# Patient Record
Sex: Male | Born: 1960 | Race: White | Hispanic: No | Marital: Married | Smoking: Never smoker
Health system: Southern US, Community
[De-identification: ages and names within clinical notes are randomized; demographics above are authoritative.]

## PROBLEM LIST (undated history)

## (undated) DIAGNOSIS — I639 Cerebral infarction, unspecified: Secondary | ICD-10-CM

## (undated) DIAGNOSIS — I219 Acute myocardial infarction, unspecified: Secondary | ICD-10-CM

## (undated) DIAGNOSIS — I10 Essential (primary) hypertension: Secondary | ICD-10-CM

---

## 2017-01-06 ENCOUNTER — Emergency Department (HOSPITAL_COMMUNITY)
Admission: EM | Admit: 2017-01-06 | Discharge: 2017-01-06 | Disposition: A | Discharge status: Home / Self Care | Payer: Managed Care, Other (non HMO) | Attending: Emergency Medicine | Admitting: Emergency Medicine

## 2017-01-06 ENCOUNTER — Encounter (HOSPITAL_COMMUNITY): Payer: Self-pay | Admitting: Emergency Medicine

## 2017-01-06 ENCOUNTER — Emergency Department (HOSPITAL_COMMUNITY): Payer: Managed Care, Other (non HMO)

## 2017-01-06 DIAGNOSIS — I252 Old myocardial infarction: Secondary | ICD-10-CM | POA: Diagnosis not present

## 2017-01-06 DIAGNOSIS — R55 Syncope and collapse: Secondary | ICD-10-CM | POA: Diagnosis not present

## 2017-01-06 DIAGNOSIS — R4781 Slurred speech: Secondary | ICD-10-CM | POA: Diagnosis not present

## 2017-01-06 DIAGNOSIS — R479 Unspecified speech disturbances: Secondary | ICD-10-CM | POA: Diagnosis present

## 2017-01-06 DIAGNOSIS — I10 Essential (primary) hypertension: Secondary | ICD-10-CM | POA: Insufficient documentation

## 2017-01-06 DIAGNOSIS — F10129 Alcohol abuse with intoxication, unspecified: Secondary | ICD-10-CM | POA: Insufficient documentation

## 2017-01-06 DIAGNOSIS — Z8673 Personal history of transient ischemic attack (TIA), and cerebral infarction without residual deficits: Secondary | ICD-10-CM | POA: Insufficient documentation

## 2017-01-06 DIAGNOSIS — F10920 Alcohol use, unspecified with intoxication, uncomplicated: Secondary | ICD-10-CM

## 2017-01-06 HISTORY — DX: Cerebral infarction, unspecified: I63.9

## 2017-01-06 HISTORY — DX: Acute myocardial infarction, unspecified: I21.9

## 2017-01-06 HISTORY — DX: Essential (primary) hypertension: I10

## 2017-01-06 LAB — COMPREHENSIVE METABOLIC PANEL
ALBUMIN: 4.1 g/dL (ref 3.5–5.0)
ALK PHOS: 69 U/L (ref 38–126)
ALT: 86 U/L — ABNORMAL HIGH (ref 17–63)
ANION GAP: 16 — AB (ref 5–15)
AST: 53 U/L — ABNORMAL HIGH (ref 15–41)
BILIRUBIN TOTAL: 0.6 mg/dL (ref 0.3–1.2)
BUN: 14 mg/dL (ref 6–20)
CALCIUM: 8.6 mg/dL — AB (ref 8.9–10.3)
CO2: 17 mmol/L — ABNORMAL LOW (ref 22–32)
Chloride: 107 mmol/L (ref 101–111)
Creatinine, Ser: 1.35 mg/dL — ABNORMAL HIGH (ref 0.61–1.24)
GFR calc non Af Amer: 58 mL/min — ABNORMAL LOW (ref >60)
GLUCOSE: 140 mg/dL — AB (ref 65–99)
Potassium: 3.7 mmol/L (ref 3.5–5.1)
Sodium: 140 mmol/L (ref 135–145)
TOTAL PROTEIN: 6.9 g/dL (ref 6.5–8.1)

## 2017-01-06 LAB — APTT: aPTT: 25 seconds (ref 24–36)

## 2017-01-06 LAB — CBC
HEMATOCRIT: 47.3 % (ref 39.0–52.0)
HEMOGLOBIN: 17 g/dL (ref 13.0–17.0)
MCH: 34.1 pg — AB (ref 26.0–34.0)
MCHC: 35.9 g/dL (ref 30.0–36.0)
MCV: 95 fL (ref 78.0–100.0)
Platelets: 147 10*3/uL — ABNORMAL LOW (ref 150–400)
RBC: 4.98 MIL/uL (ref 4.22–5.81)
RDW: 12.8 % (ref 11.5–15.5)
WBC: 6.8 10*3/uL (ref 4.0–10.5)

## 2017-01-06 LAB — CBG MONITORING, ED: GLUCOSE-CAPILLARY: 141 mg/dL — AB (ref 65–99)

## 2017-01-06 LAB — DIFFERENTIAL
Basophils Absolute: 0 10*3/uL (ref 0.0–0.1)
Basophils Relative: 1 %
EOS PCT: 6 %
Eosinophils Absolute: 0.4 10*3/uL (ref 0.0–0.7)
LYMPHS ABS: 2.5 10*3/uL (ref 0.7–4.0)
LYMPHS PCT: 36 %
MONO ABS: 0.5 10*3/uL (ref 0.1–1.0)
MONOS PCT: 7 %
Neutro Abs: 3.4 10*3/uL (ref 1.7–7.7)
Neutrophils Relative %: 50 %

## 2017-01-06 LAB — ETHANOL: Alcohol, Ethyl (B): 306 mg/dL (ref <5)

## 2017-01-06 LAB — PROTIME-INR
INR: 0.98
Prothrombin Time: 13 seconds (ref 11.4–15.2)

## 2017-01-06 LAB — I-STAT TROPONIN, ED: Troponin i, poc: 0 ng/mL (ref 0.00–0.08)

## 2017-01-06 NOTE — ED Notes (Signed)
Pt asking what happened  He was at a party and drank too much alcohol.  He has texted his wife

## 2017-01-06 NOTE — ED Notes (Signed)
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

## 2017-01-06 NOTE — ED Notes (Signed)
Please contact wife, @ [phone] Lynelle Doctor(Juli [place]ashomb)

## 2017-01-06 NOTE — ED Notes (Signed)
The pts wife has called and talked to him from [place]

## 2017-01-06 NOTE — ED Notes (Signed)
Pt given a urinal  Sleeping unless disturbed

## 2017-01-06 NOTE — Consult Note (Signed)
Neurology Consultation Reason for Consult: Difficulty with speech Referring Physician: Blinda LeatherwoodPollina, C  CC: Speech difficulty  History is obtained from: Patient  HPI: Twana FirstJohn Winship is a 56 y.o. male who is in his normal state of health earlier tonight. He began drinking with friends and then he was seen by his friends to slump to the side and drop his pizza on himself. Since that time, he has been having persistent difficulty with speech and therefore EMS was called.  LKW: Unclear, but likely around 11 PM tpa given?: no, mild symptoms   ROS: Limited due to degree of intoxication  PMHx: Heart attack Stroke  Family history: Did not obtain due to degree of intoxication  Social History: Had a "lot" to drink tonight  Exam: Current vital signs: Vitals:   01/06/17 0203  BP: (!) 131/100  Pulse: 79  Resp: 16  Temp: 97.5 F (36.4 C)    Vital signs in last 24 hours:    Physical Exam  Constitutional: Appears well-developed and well-nourished.  Psych: Affect appropriate to situation Eyes: No scleral injection HENT: No OP obstrucion Head: Normocephalic.  Cardiovascular: Normal rate and regular rhythm.  Respiratory: Effort normal and breath sounds normal to anterior ascultation GI: Soft.  No distension. There is no tenderness.  Skin: WDI  Neuro: Mental Status: Patient is awake, alert, oriented to person, place, month, year, and situation. Patient is able to give a clear and coherent history. No signs of neglect He has some difficulty with word finding and is slightly tangential in conversation, appears intoxicated. He is dysarthric Cranial Nerves: II: Visual Fields are full. Pupils are equal, round, and reactive to light.   III,IV, VI: EOMI without ptosis or diploplia. He has mild nystagmus V: Facial sensation is symmetric to temperature VII: Facial movement is symmetric.  VIII: hearing is intact to voice X: Uvula elevates symmetrically XI: Shoulder shrug is symmetric. XII:  tongue is midline without atrophy or fasciculations.  Motor: Tone is normal. Bulk is normal. 5/5 strength was present in all four extremities.  Sensory: Sensation is symmetric to light touch and temperature in the arms and legs. Cerebellar: He has mild ataxia on finger-nose-finger bilaterally    I have reviewed labs in epic and the results pertinent to this consultation are: Mild thrombocytopenia  I have reviewed the images obtained: CT head-old stroke, nothing acute  Impression: 56 year old male with a history of stroke who presents with altered mental status in the setting of severe intoxication. I suspect that this all is related to his intoxication, however he does seem to have slightly more word finding difficulty then I would expect. Given his history of stroke, it would not be unreasonable to obtain an MRI.  Both because I am not certain of his last known well given the amount that he has had to drink as well as the fact that his symptoms are mild and I would not favor IV TPA at this time.  Recommendations: 1) could consider MRI brain 2) certainly if he sobers up and still has residual symptoms, then an MRI would be indicated. 3) if MRI is negative, or if he is asymptomatic after sobering up then no further recommendations.   Ritta SlotMcNeill Maryiah Olvey, MD [facility] [phone]  If 7pm- 7am, please page neurology on call as listed in AMION.

## 2017-01-06 NOTE — ED Notes (Signed)
Mri calling for the pt

## 2017-01-06 NOTE — ED Provider Notes (Signed)
[facility]-EMERGENCY DEPT Provider Note   CSN: [id number] Arrival date & time: 01/06/17  0131  By signing my name below, I, Dushaun Okey, attest that this documentation has been prepared under the direction and in the presence of Gilda Crease, MD. Electronically Signed: Elder Negus, Scribe. 01/06/17. 5:41 AM.   History   Chief Complaint Chief Complaint  Patient presents with  . Code Stroke    stroke like symptoms    HPI Gary Love is a 56 y.o. male with reported history of a prior stroke and MI who presents to the ED as a CODE STROKE. According to medic report, this patient was in his usual health until 2 hours ago while drinking alcohol with his friends. The friends stated that he suddenly fell to the left and "dropped everything". At interview, the patient is having difficulty forming long sentences. He is unable to fully characterize his symptoms. Medic denies any focal weaknesses or decreased sensation. No facial asymmetry.  Patient presents to the emergency department for evaluation of syncope. Patient was with friends and drinking alcohol. He apparently slumped over and then when he awakened he was having trouble speaking. Patient told EMS that he has a history of a heart attack and a stroke. When they noticed that he was having difficulty speaking, they initiated a code stroke.  At arrival, patient is extremely intoxicated. He is having difficulty answering questions. Level V Caveat due to intoxication.   The history is provided by the patient. No language interpreter was used.    Past Medical History:  Diagnosis Date  . Hypertension   . MI (myocardial infarction)   . Stroke [facility])     There are no active problems to display for this patient.   History reviewed. No pertinent surgical history.     Home Medications    Prior to Admission medications   Not on File    Family History History reviewed. No pertinent family history.  Social History Social  History  Substance Use Topics  . Smoking status: Never Smoker  . Smokeless tobacco: Never Used  . Alcohol use Yes     Allergies   Patient has no known allergies.   Review of Systems Review of Systems  Unable to perform ROS: Other  Neurological: Negative for facial asymmetry, weakness and numbness.       Speech problem  All other systems reviewed and are negative.    Physical Exam Updated Vital Signs BP (!) 144/74   Pulse 88   Temp 97.5 F (36.4 C) (Oral)   Resp (!) 21   Ht 5\' 11"  (1.803 m)   Wt 254 lb 6.6 oz (115.4 kg)   SpO2 99%   BMI 35.48 kg/m   Physical Exam  Constitutional: He is oriented to person, place, and time. He appears well-developed and well-nourished. No distress.  HENT:  Head: Normocephalic and atraumatic.  Right Ear: Hearing normal.  Left Ear: Hearing normal.  Nose: Nose normal.  Mouth/Throat: Oropharynx is clear and moist and mucous membranes are normal.  Eyes: Conjunctivae and EOM are normal. Pupils are equal, round, and reactive to light.  Neck: Normal range of motion. Neck supple.  Cardiovascular: Regular rhythm, S1 normal and S2 normal.  Exam reveals no gallop and no friction rub.   No murmur heard. Pulmonary/Chest: Effort normal and breath sounds normal. No respiratory distress. He exhibits no tenderness.  Abdominal: Soft. Normal appearance and bowel sounds are normal. There is no hepatosplenomegaly. There is no tenderness. There is no  rebound, no guarding, no tenderness at McBurney's point and negative Murphy's sign. No hernia.  Musculoskeletal: Normal range of motion.  Neurological: He is alert and oriented to person, place, and time. He has normal strength. No cranial nerve deficit or sensory deficit. Coordination normal. GCS eye subscore is 4. GCS verbal subscore is 5. GCS motor subscore is 6.  Extraocular muscle movement: normal No visual field cut Pupils: equal and reactive both direct and consensual response is normal No nystagmus  present    Sensory function is intact to light touch, pinprick Proprioception intact  Grip strength 5/5 symmetric in upper extremities No pronator drift Normal finger to nose bilaterally  Lower extremity strength 5/5 against gravity Normal heel to shin bilaterally    Skin: Skin is warm, dry and intact. No rash noted. No cyanosis.  Psychiatric: He has a normal mood and affect. His speech is normal and behavior is normal. Thought content normal.  Nursing note and vitals reviewed.    ED Treatments / Results  Labs (all labs ordered are listed, but only abnormal results are displayed) Labs Reviewed  CBC - Abnormal; Notable for the following:       Result Value   MCH 34.1 (*)    Platelets 147 (*)    All other components within normal limits  COMPREHENSIVE METABOLIC PANEL - Abnormal; Notable for the following:    CO2 17 (*)    Glucose, Bld 140 (*)    Creatinine, Ser 1.35 (*)    Calcium 8.6 (*)    AST 53 (*)    ALT 86 (*)    GFR calc non Af Amer 58 (*)    Anion gap 16 (*)    All other components within normal limits  ETHANOL - Abnormal; Notable for the following:    Alcohol, Ethyl (B) 306 (*)    All other components within normal limits  CBG MONITORING, ED - Abnormal; Notable for the following:    Glucose-Capillary 141 (*)    All other components within normal limits  PROTIME-INR  APTT  DIFFERENTIAL  I-STAT TROPOININ, ED  I-STAT CHEM 8, ED    EKG  EKG Interpretation  Date/Time:  Sunday January 06 2017 01:54:55 EDT Ventricular Rate:  80 PR Interval:    QRS Duration: 88 QT Interval:  398 QTC Calculation: 460 R Axis:   82 Text Interpretation:  Sinus rhythm Anterior infarct, old Borderline repolarization abnormality Baseline wander in lead(s) I II aVR No previous tracing Confirmed by Blinda LeatherwoodPOLLINA  MD, CHRISTOPHER [phone] on 01/06/2017 2:08:45 AM       Radiology Mr Brain Wo Contrast  Result Date: 01/06/2017 CLINICAL DATA:  Speech difficulty. EXAM: MRI HEAD WITHOUT  CONTRAST TECHNIQUE: Multiplanar, multiecho pulse sequences of the brain and surrounding structures were obtained without intravenous contrast. COMPARISON:  Head CT 01/06/2017 FINDINGS: Despite efforts by the technologist and patient, motion artifact is present on today's examination and could not be eliminated. This reduces the sensitivity and specificity of the study. Brain: Diffusion-weighted imaging is severely degraded by motion, but there is no large area of ischemia. There is a normal disc infarct along the lateral aspect of the right lentiform nucleus extending along the right external capsule. Minimal periventricular T2 hyperintensity. No hydrocephalus or extra-axial fluid collection. The midline structures are normal. No age advanced or lobar predominant atrophy. Vascular: Major intracranial arterial and venous sinus flow voids are preserved. No evidence of chronic microhemorrhage or amyloid angiopathy. Skull and upper cervical spine: The visualized skull base, calvarium, upper  cervical spine and extracranial soft tissues are normal. Sinuses/Orbits: No fluid levels or advanced mucosal thickening. No mastoid effusion. Normal orbits. IMPRESSION: Motion degraded examination without visible evidence of acute intracranial abnormality. 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 Electronically Signed   By: Deatra Robinson M.D.   On: 01/06/2017 05:11   Ct Head Code Stroke W/o Cm  Result Date: 01/06/2017 CLINICAL DATA:  Code stroke.  Altered mental status EXAM: CT HEAD WITHOUT CONTRAST TECHNIQUE: Contiguous axial images were obtained from the base of the skull through the vertex without intravenous contrast. COMPARISON:  None. FINDINGS: Brain: There is an old right lenticular capsular infarct. No evidence of a a acute cortical infarct. There is periventricular hypoattenuation compatible with chronic microvascular disease. No intracranial hemorrhage. No midline shift or mass effect. Vascular: No  hyperdense vessel. Skull: Normal Sinuses/Orbits: The visualized portions of the paranasal sinuses and mastoid air cells are free of fluid. No advanced mucosal thickening. The visualized orbits are normal. Other: None ASPECTS (Alberta Stroke Program Early CT Score) - Ganglionic level infarction (caudate, lentiform nuclei, internal capsule, insula, M1-M3 cortex): 7 - Supraganglionic infarction (M4-M6 cortex): 3 Total score (0-10 with 10 being normal): 10 IMPRESSION: 1. Old right lenticulocapsular infarct without acute intracranial abnormality. 2. ASPECTS is 10. These results were called by telephone at the time of interpretation on 01/06/2017 at 1:53 am to Dr. Ritta Slot, who verbally acknowledged these results. Electronically Signed   By: Deatra Robinson M.D.   On: 01/06/2017 01:54    Procedures Procedures (including critical care time)  Medications Ordered in ED Medications - No data to display   Initial Impression / Assessment and Plan / ED Course  I have reviewed the triage vital signs and the nursing notes.  Pertinent labs & imaging results that were available during my care of the patient were reviewed by me and considered in my medical decision making (see chart for details).     Patient brought to the ER as a code stroke. Patient passed out while drinking alcohol. When he awakened he was slurring his words and having difficulty finding words, EMS initiated code stroke because of his speech difficulty. Upon arrival to the ER, he does not examine as if he has expressive aphasia. Symptoms are only intermittently present and most likely consistent with alcohol intoxication. With his previous history, however, workup was initiated. Screening CT did not show any acute abnormality. He did not have any focal findings on his neurologic exam. It was decided, in conjunction with Dr. Amada Jupiter of neurology, to perform MRI to rule out stroke. MRI has been performed and does not show any evidence of  acute stroke. Symptoms are all likely secondary to his intoxication. Patient will be allowed to sober up and then will be discharged.  Final Clinical Impressions(s) / ED Diagnoses   Final diagnoses:  Syncope, unspecified syncope type  Alcoholic intoxication without complication (HCC)    New Prescriptions New Prescriptions   No medications on file  I personally performed the services described in this documentation, which was scribed in my presence. The recorded information has been reviewed and is accurate.    Gilda Crease, MD 01/06/17 [phone]

## 2017-01-06 NOTE — ED Triage Notes (Signed)
Pt brought to ED by [facility] from a business party after having some stroke symptoms, per EMS, pt was eating and he dropped his food on the left side and fell to the left side. Having some aphasia on EMS arrival, some ETOH on board, prior history of stroke. BP 137/87, HR 88, CBG 128.

## 2017-01-06 NOTE — ED Notes (Signed)
Pt passes the swallow screen 

## 2018-10-13 IMAGING — MR MR HEAD W/O CM
10 of 12 series · 42 of 48 positions shown · non-contrast
Comparison: Head CT 01/06/2017

CLINICAL DATA: Speech difficulty.

EXAM:
MRI HEAD WITHOUT CONTRAST
TECHNIQUE: Multiplanar, multiecho pulse sequences of the brain and surrounding
structures were obtained without intravenous contrast.

[Series 4: DWI · axial · 3.0mm · 1.09mm/px · z∈[-59,+88]mm · 9 of 100 slices shown (1 of 6)]
[im 1/100]
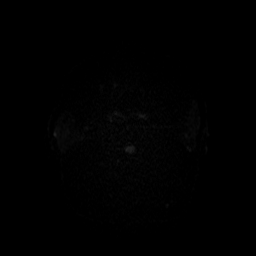
[im 13/100]
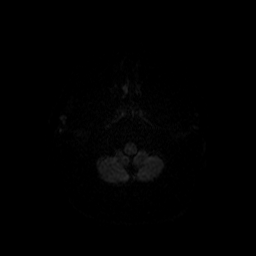
[im 25/100]
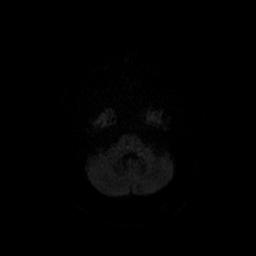
[im 38/100]
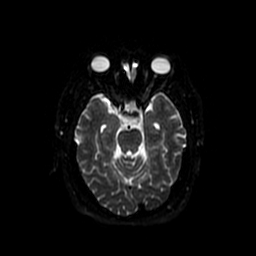
[im 50/100]
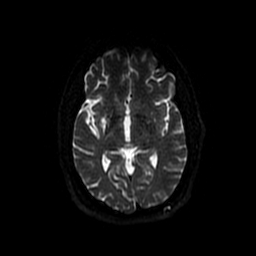
[im 62/100]
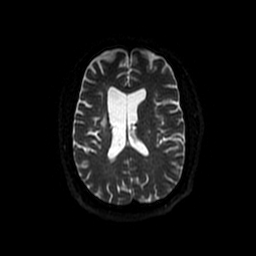
[im 75/100]
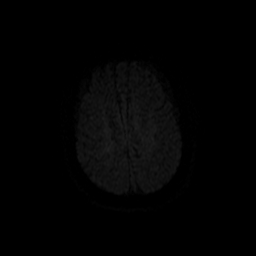
[im 87/100]
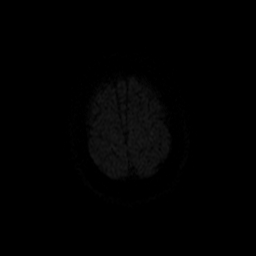
[im 100/100]
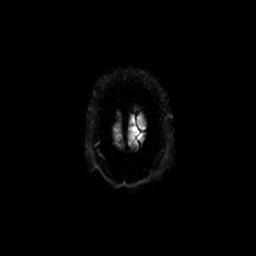

[Series 5: T2 · axial · 5.0mm · 0.43mm/px · z∈[-59,+13]mm · 2 of 25 slices shown]
[im 1/25]
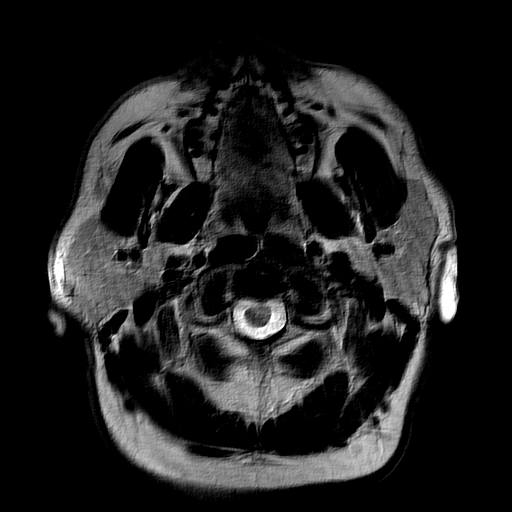
[im 13/25]
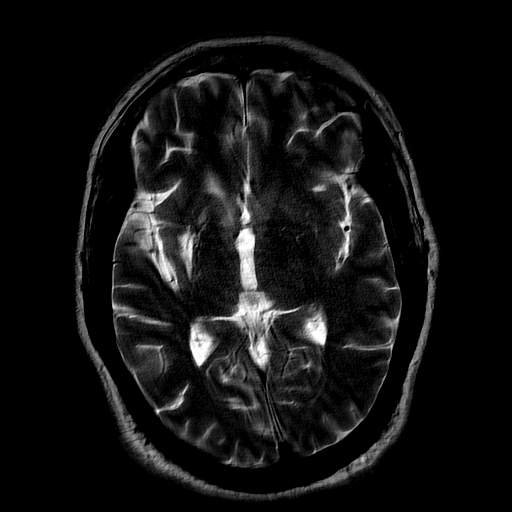

[Series 6: DWI · axial · 3.0mm · 1.09mm/px · z∈[-59,+88]mm · 8 of 100 slices shown (2 of 6)]
[im 1/100]
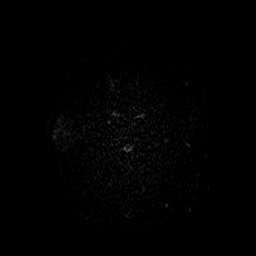
[im 15/100]
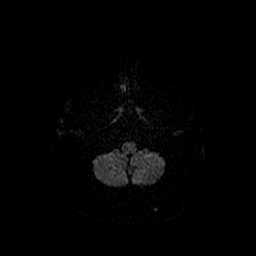
[im 29/100]
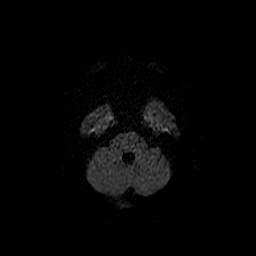
[im 43/100]
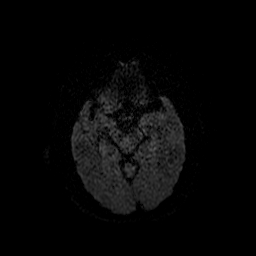
[im 57/100]
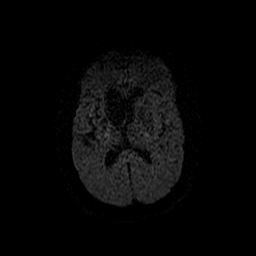
[im 71/100]
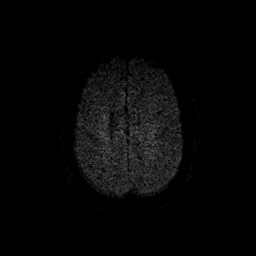
[im 85/100]
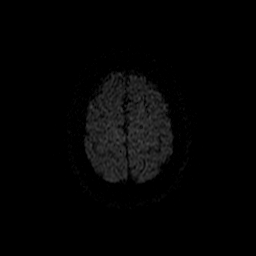
[im 100/100]
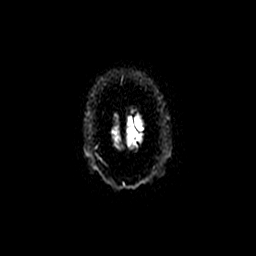

[Series 7: FLAIR · sagittal · 5.0mm · 0.47mm/px · 2 of 25 slices shown (1 of 3)]
[im 1/25]
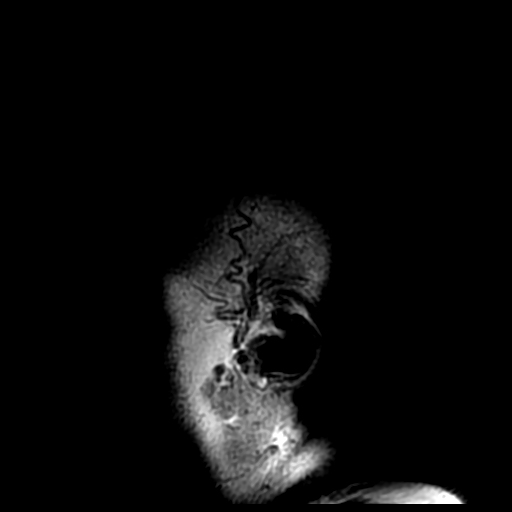
[im 25/25]
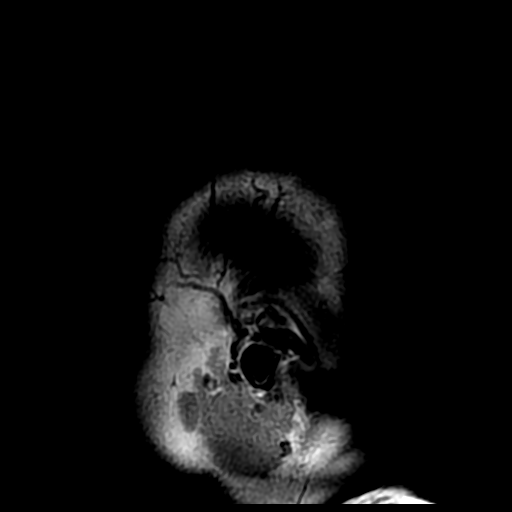

[Series 8: FLAIR · axial · 5.0mm · 0.43mm/px · z∈[-59,+85]mm · 2 of 25 slices shown (2 of 3)]
[im 1/25]
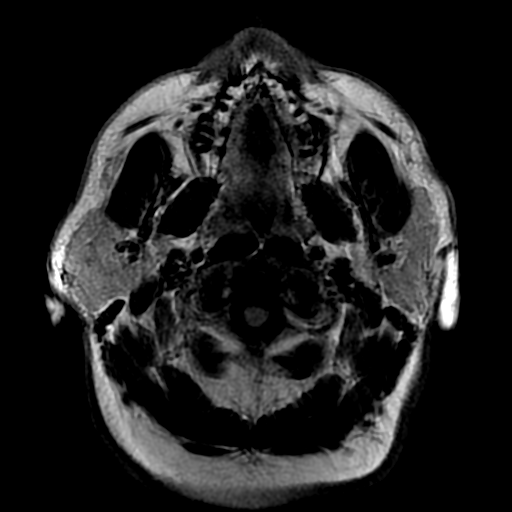
[im 25/25]
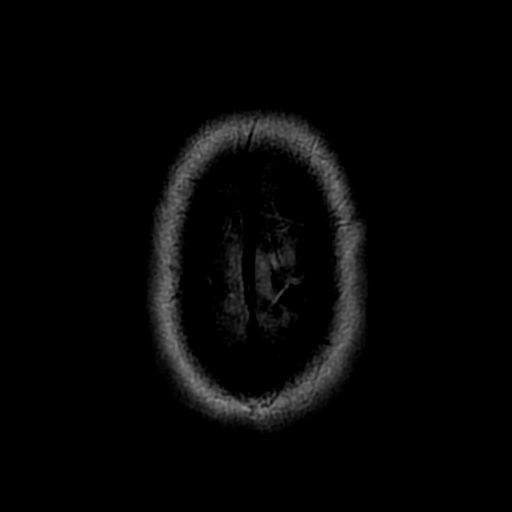

[Series 9: FLAIR · axial · 5.0mm · 0.43mm/px · z∈[-59,+85]mm · 2 of 25 slices shown (3 of 3)]
[im 1/25]
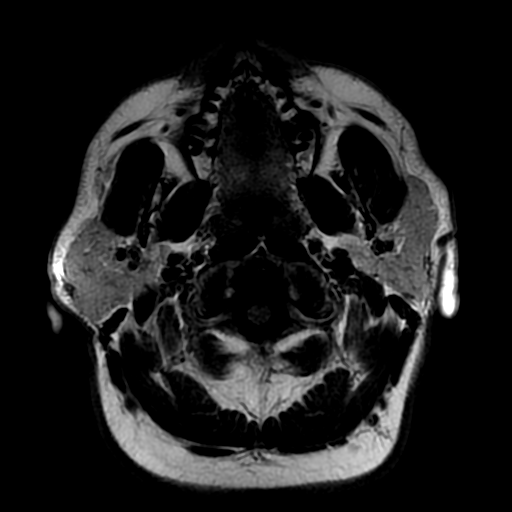
[im 25/25]
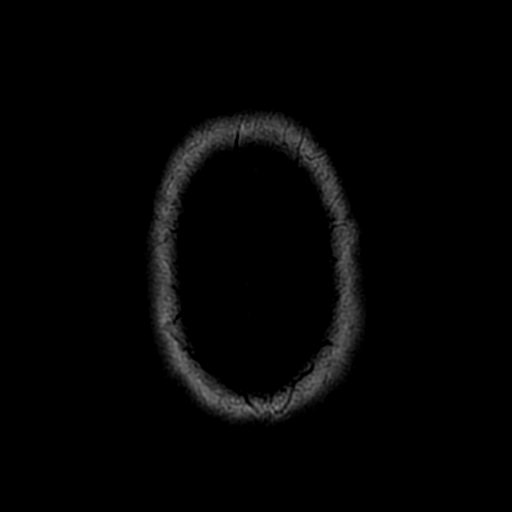

[Series 10: DWI · coronal · 5.0mm · 1.09mm/px · 6 of 72 slices shown (3 of 6)]
[im 1/72]
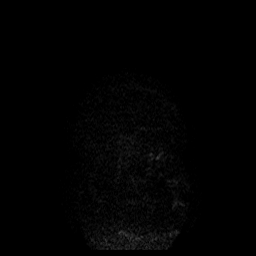
[im 15/72]
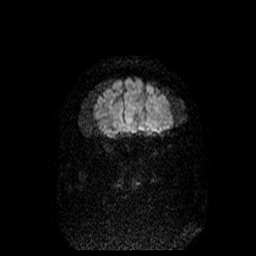
[im 29/72]
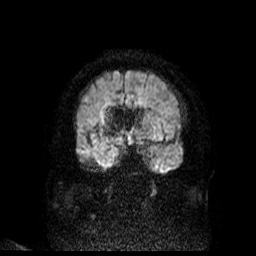
[im 43/72]
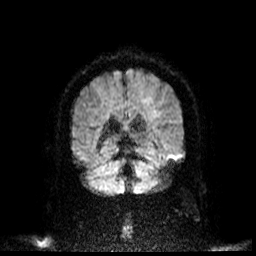
[im 57/72]
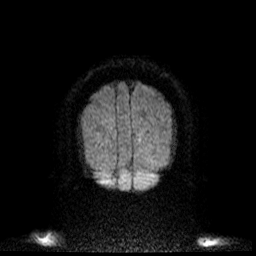
[im 72/72]
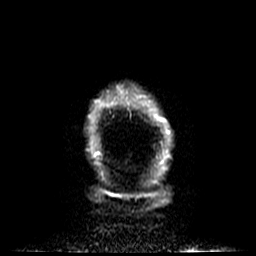

[Series 400: DWI · axial · 3.0mm · 1.09mm/px · z∈[-59,+88]mm · 4 of 50 slices shown (4 of 6)]
[im 1/50]
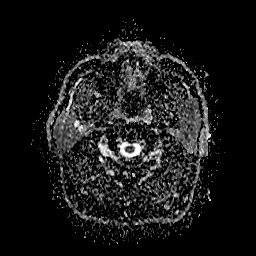
[im 17/50]
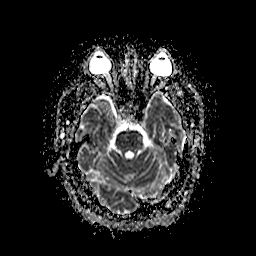
[im 33/50]
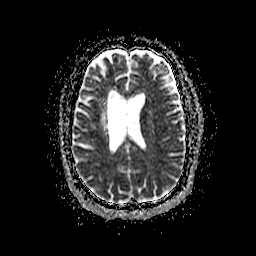
[im 50/50]
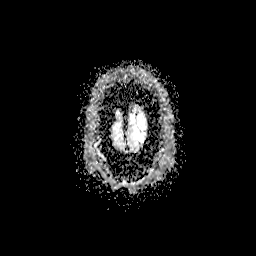

[Series 600: DWI · axial · 3.0mm · 1.09mm/px · z∈[-59,+88]mm · 4 of 50 slices shown (5 of 6)]
[im 1/50]
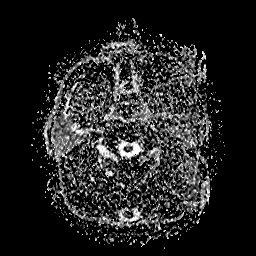
[im 17/50]
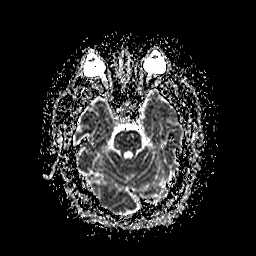
[im 33/50]
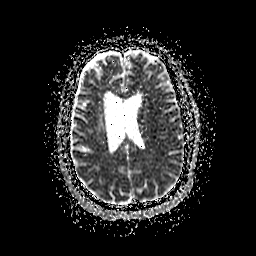
[im 50/50]
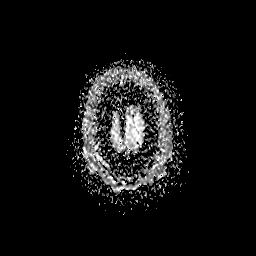

[Series 1000: DWI · coronal · 5.0mm · 1.09mm/px · 3 of 36 slices shown (6 of 6)]
[im 1/36]
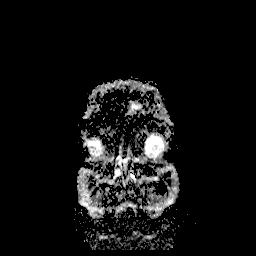
[im 18/36]
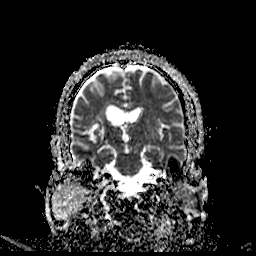
[im 36/36]
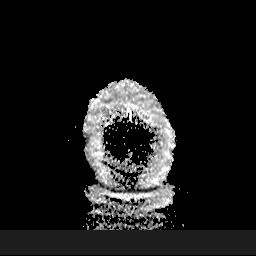

[42 of 48 positions shown; findings below may reference images not displayed]

FINDINGS: Despite efforts by the technologist and patient, motion artifact is
present on today's examination and could not be eliminated. This
reduces the sensitivity and specificity of the study.

Brain: Diffusion-weighted imaging is severely degraded by motion,
but there is no large area of ischemia. There is a normal disc
infarct along the lateral aspect of the right lentiform nucleus
extending along the right external capsule. Minimal periventricular
T2 hyperintensity. No hydrocephalus or extra-axial fluid collection.
The midline structures are normal. No age advanced or lobar
predominant atrophy.

Vascular: Major intracranial arterial and venous sinus flow voids
are preserved. No evidence of chronic microhemorrhage or amyloid
angiopathy.

Skull and upper cervical spine: The visualized skull base,
calvarium, upper cervical spine and extracranial soft tissues are
normal.

Sinuses/Orbits: No fluid levels or advanced mucosal thickening. No
mastoid effusion. Normal orbits.
IMPRESSION: Motion degraded examination without visible evidence of acute
intracranial abnormality. Old right lentiform nucleus/external
capsule infarct.
